# Patient Record
Sex: Female | Born: 1935 | Race: White | Hispanic: No
Health system: Southern US, Community
[De-identification: ages and names within clinical notes are randomized; demographics above are authoritative.]

---

## 2002-02-04 ENCOUNTER — Observation Stay (HOSPITAL_COMMUNITY): Admission: RE | Admit: 2002-02-04 | Discharge: 2002-02-05 | Payer: Self-pay | Admitting: Neurology

## 2002-02-04 ENCOUNTER — Encounter: Payer: Self-pay | Admitting: Neurology

## 2002-03-31 ENCOUNTER — Encounter: Payer: Self-pay | Admitting: Neurosurgery

## 2002-04-05 ENCOUNTER — Inpatient Hospital Stay (HOSPITAL_COMMUNITY): Admission: RE | Admit: 2002-04-05 | Discharge: 2002-04-06 | Payer: Self-pay | Admitting: Neurosurgery

## 2002-05-10 ENCOUNTER — Encounter: Payer: Self-pay | Admitting: Neurosurgery

## 2002-05-10 ENCOUNTER — Encounter: Admission: RE | Admit: 2002-05-10 | Discharge: 2002-05-10 | Payer: Self-pay | Admitting: Neurosurgery

## 2003-07-01 ENCOUNTER — Ambulatory Visit (HOSPITAL_COMMUNITY): Admission: RE | Admit: 2003-07-01 | Discharge: 2003-07-01 | Payer: Self-pay | Admitting: Neurosurgery

## 2003-09-23 ENCOUNTER — Encounter: Admission: RE | Admit: 2003-09-23 | Discharge: 2003-09-23 | Payer: Self-pay | Admitting: Neurosurgery

## 2003-12-15 ENCOUNTER — Inpatient Hospital Stay (HOSPITAL_COMMUNITY): Admission: RE | Admit: 2003-12-15 | Discharge: 2003-12-16 | Payer: Self-pay | Admitting: Neurosurgery

## 2004-05-10 ENCOUNTER — Ambulatory Visit: Payer: Self-pay | Admitting: Oncology

## 2004-06-26 ENCOUNTER — Ambulatory Visit: Payer: Self-pay | Admitting: Ophthalmology

## 2004-07-03 ENCOUNTER — Ambulatory Visit: Payer: Self-pay | Admitting: Ophthalmology

## 2004-11-07 ENCOUNTER — Ambulatory Visit: Payer: Self-pay | Admitting: Oncology

## 2004-12-13 ENCOUNTER — Ambulatory Visit: Payer: Self-pay | Admitting: Internal Medicine

## 2005-05-06 ENCOUNTER — Ambulatory Visit: Payer: Self-pay | Admitting: Oncology

## 2005-05-23 ENCOUNTER — Ambulatory Visit: Payer: Self-pay | Admitting: Oncology

## 2005-11-12 ENCOUNTER — Ambulatory Visit: Payer: Self-pay | Admitting: Oncology

## 2005-11-20 ENCOUNTER — Ambulatory Visit: Payer: Self-pay | Admitting: Oncology

## 2006-08-04 ENCOUNTER — Ambulatory Visit: Payer: Self-pay | Admitting: Oncology

## 2006-08-05 ENCOUNTER — Ambulatory Visit: Payer: Self-pay | Admitting: Oncology

## 2006-08-21 ENCOUNTER — Ambulatory Visit: Payer: Self-pay | Admitting: Oncology

## 2006-09-21 ENCOUNTER — Ambulatory Visit: Payer: Self-pay | Admitting: Oncology

## 2006-11-30 ENCOUNTER — Inpatient Hospital Stay: Payer: Self-pay | Admitting: Specialist

## 2006-11-30 ENCOUNTER — Other Ambulatory Visit: Payer: Self-pay

## 2007-01-15 ENCOUNTER — Ambulatory Visit: Payer: Self-pay | Admitting: Gastroenterology

## 2007-01-21 ENCOUNTER — Ambulatory Visit: Payer: Self-pay | Admitting: Oncology

## 2007-02-09 ENCOUNTER — Ambulatory Visit: Payer: Self-pay | Admitting: Oncology

## 2007-02-21 ENCOUNTER — Ambulatory Visit: Payer: Self-pay | Admitting: Oncology

## 2007-05-05 ENCOUNTER — Emergency Department: Payer: Self-pay | Admitting: Internal Medicine

## 2007-05-11 ENCOUNTER — Ambulatory Visit: Payer: Self-pay | Admitting: General Practice

## 2007-07-22 ENCOUNTER — Ambulatory Visit: Payer: Self-pay | Admitting: Oncology

## 2007-07-27 ENCOUNTER — Ambulatory Visit: Payer: Self-pay | Admitting: Oncology

## 2007-08-21 ENCOUNTER — Ambulatory Visit: Payer: Self-pay | Admitting: Oncology

## 2007-09-21 ENCOUNTER — Ambulatory Visit: Payer: Self-pay | Admitting: Oncology

## 2007-10-21 ENCOUNTER — Ambulatory Visit: Payer: Self-pay | Admitting: Oncology

## 2007-12-22 ENCOUNTER — Ambulatory Visit: Payer: Self-pay | Admitting: Oncology

## 2008-01-18 ENCOUNTER — Ambulatory Visit: Payer: Self-pay | Admitting: Oncology

## 2008-01-21 ENCOUNTER — Ambulatory Visit: Payer: Self-pay | Admitting: Oncology

## 2008-12-06 ENCOUNTER — Ambulatory Visit: Payer: Self-pay | Admitting: Oncology

## 2008-12-21 ENCOUNTER — Ambulatory Visit: Payer: Self-pay | Admitting: Oncology

## 2009-01-20 ENCOUNTER — Ambulatory Visit: Payer: Self-pay | Admitting: Oncology

## 2009-01-24 ENCOUNTER — Ambulatory Visit: Payer: Self-pay | Admitting: Oncology

## 2009-02-20 ENCOUNTER — Ambulatory Visit: Payer: Self-pay | Admitting: Oncology

## 2010-05-04 ENCOUNTER — Emergency Department: Payer: Self-pay | Admitting: Unknown Physician Specialty

## 2010-05-15 ENCOUNTER — Emergency Department: Payer: Self-pay | Admitting: Emergency Medicine

## 2011-03-09 ENCOUNTER — Inpatient Hospital Stay: Payer: Self-pay | Admitting: Internal Medicine

## 2011-03-10 DIAGNOSIS — I359 Nonrheumatic aortic valve disorder, unspecified: Secondary | ICD-10-CM

## 2011-03-21 ENCOUNTER — Inpatient Hospital Stay: Payer: Self-pay | Admitting: Internal Medicine

## 2011-03-28 ENCOUNTER — Encounter: Payer: Medicare Other | Admitting: Cardiovascular Disease

## 2011-04-18 ENCOUNTER — Encounter: Payer: Self-pay | Admitting: Cardiovascular Disease

## 2011-06-03 ENCOUNTER — Emergency Department: Payer: Self-pay | Admitting: Emergency Medicine

## 2011-06-03 LAB — CBC
MCH: 30.4 pg (ref 26.0–34.0)
MCHC: 32.7 g/dL (ref 32.0–36.0)
Platelet: 168 10*3/uL (ref 150–440)
RBC: 3.41 10*6/uL — ABNORMAL LOW (ref 3.80–5.20)

## 2011-06-03 LAB — URINALYSIS, COMPLETE
Bilirubin,UR: NEGATIVE
Glucose,UR: 150 mg/dL (ref 0–75)
RBC,UR: 7 /HPF (ref 0–5)
Squamous Epithelial: <1

## 2011-06-03 LAB — COMPREHENSIVE METABOLIC PANEL
Albumin: 3.5 g/dL (ref 3.4–5.0)
Alkaline Phosphatase: 39 U/L — ABNORMAL LOW (ref 50–136)
Anion Gap: 12 (ref 7–16)
Calcium, Total: 9.1 mg/dL (ref 8.5–10.1)
Co2: 24 mmol/L (ref 21–32)
EGFR (Non-African Amer.): 33 — ABNORMAL LOW
Glucose: 81 mg/dL (ref 65–99)
Osmolality: 293 (ref 275–301)
Potassium: 3.7 mmol/L (ref 3.5–5.1)
SGOT(AST): 15 U/L (ref 15–37)
Sodium: 146 mmol/L — ABNORMAL HIGH (ref 136–145)

## 2011-06-03 LAB — PROTIME-INR
INR: 1
Prothrombin Time: 13.3 secs (ref 11.5–14.7)

## 2013-06-26 IMAGING — CR DG ELBOW COMPLETE 3+V*L*
1 series · 5 of 5 positions shown · non-contrast
Comparison: none

REASON FOR EXAM: fall injury
COMMENTS:

PROCEDURE:     DXR - DXR ELBOW LT COMP W/OBLIQUES  - March 21, 2011  [DATE]
RESULT:     Comparison: None.

[Series 2: x elbow obl left · 0.14mm/px · 5 of 5 slices shown]
[im 1/5]
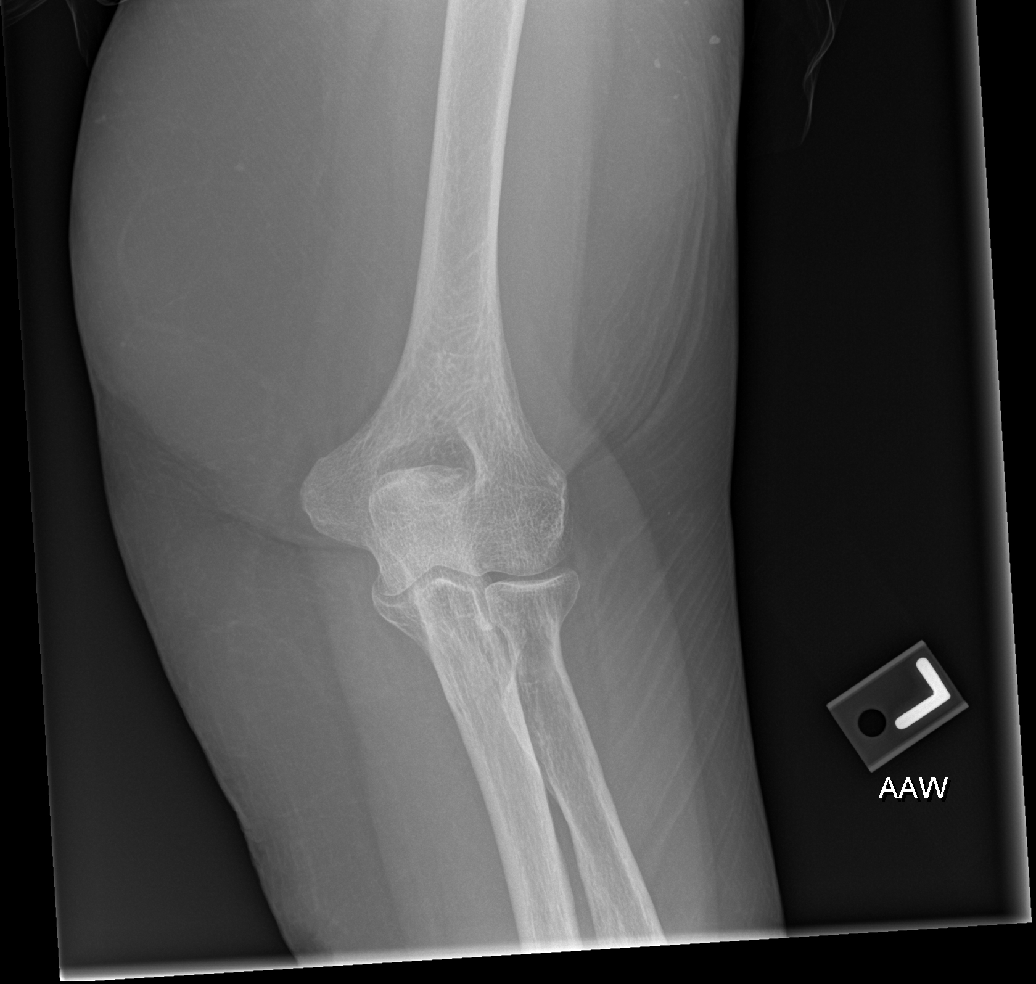
[im 2/5]
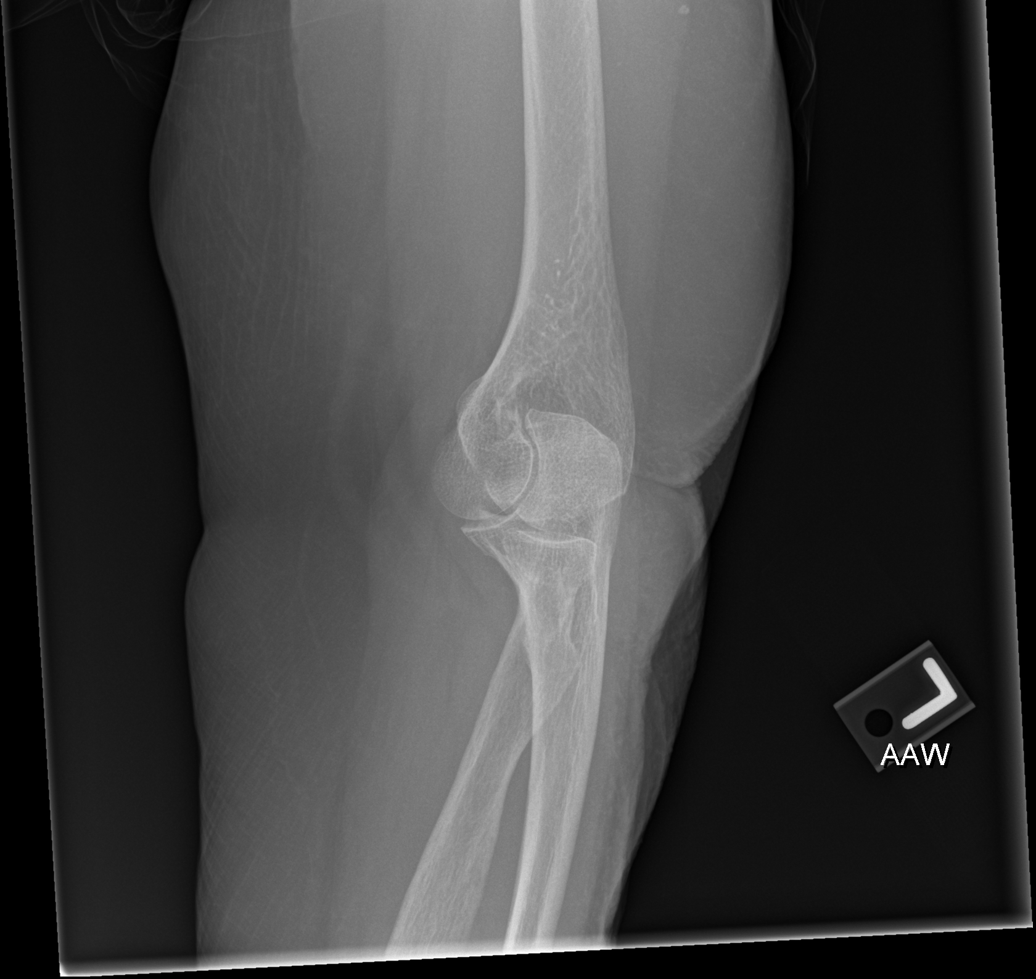
[im 3/5]
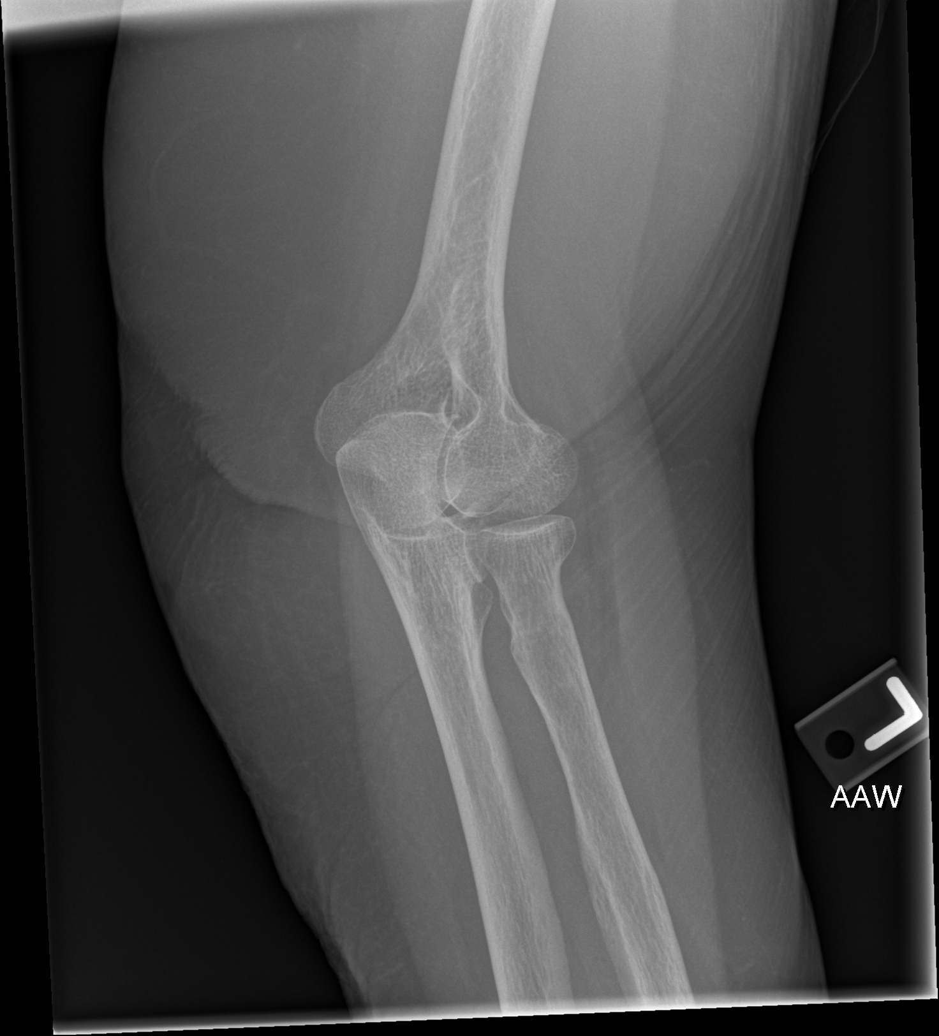
[im 4/5]
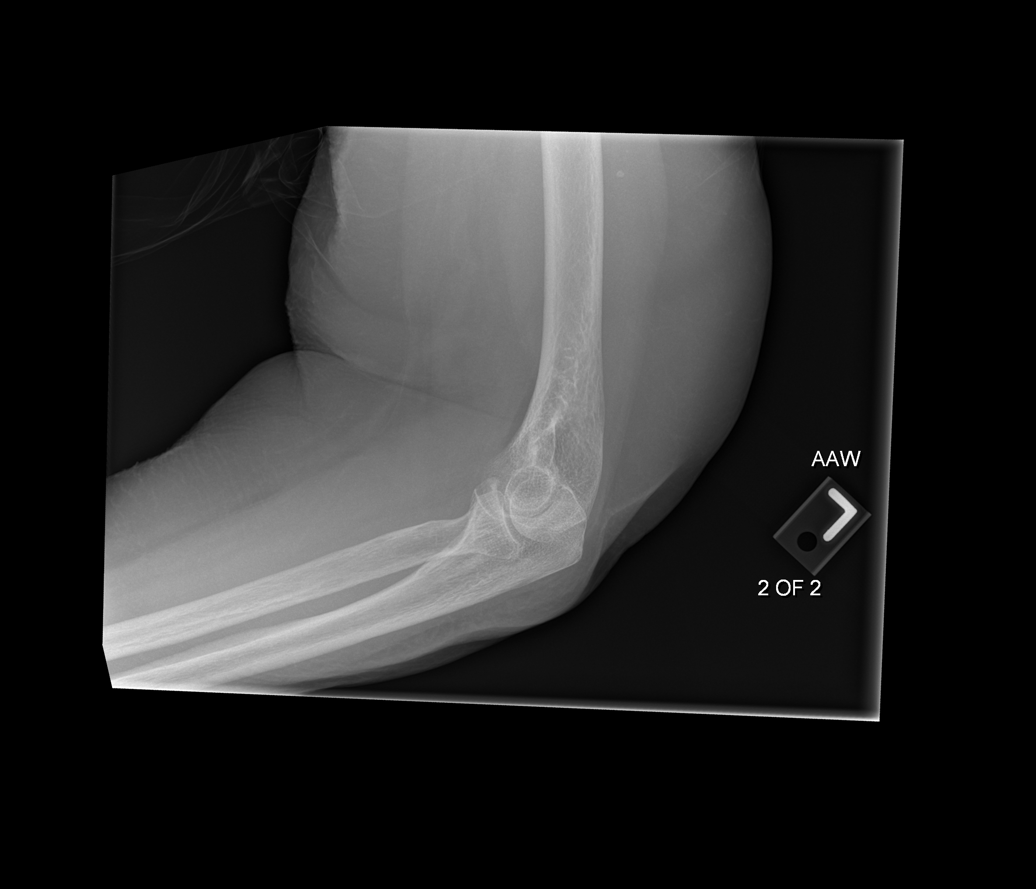
[im 5/5]
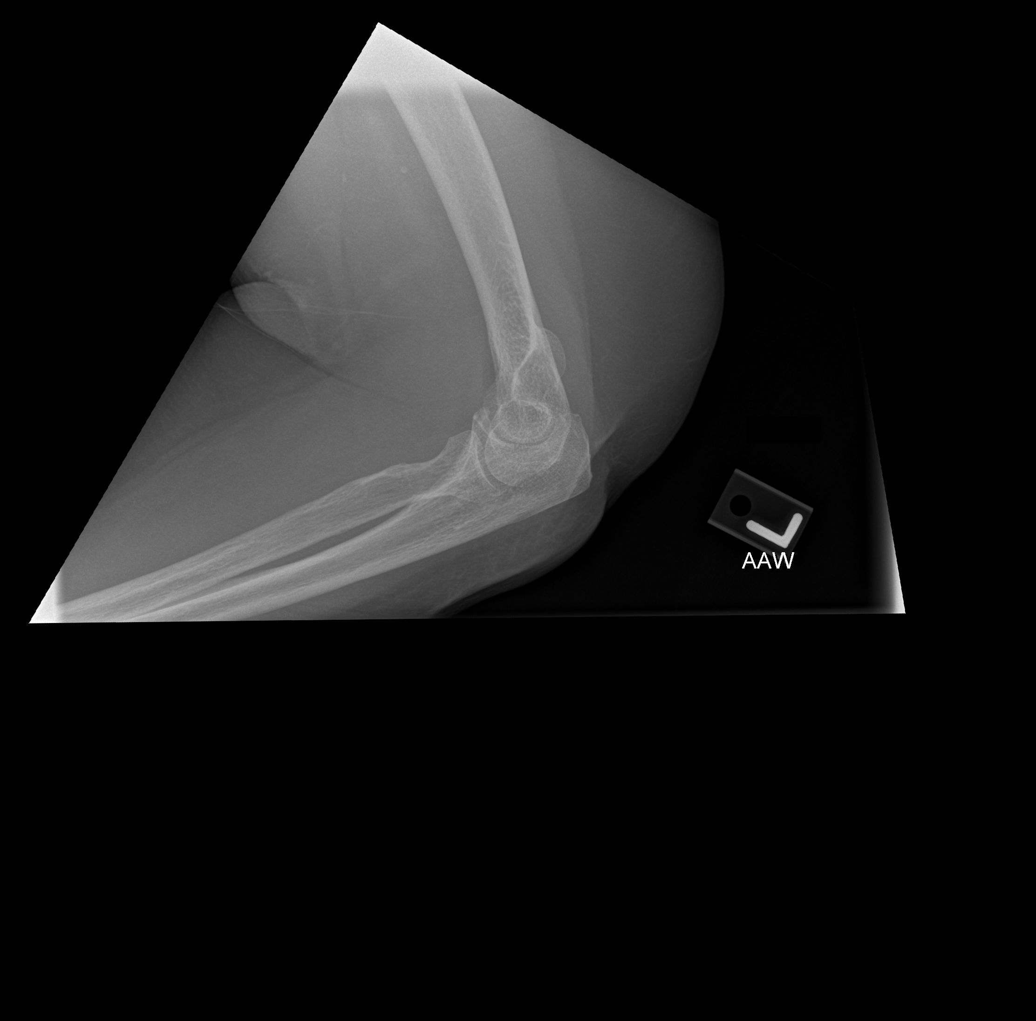

[5 of 5 positions shown; findings below may reference images not displayed]

FINDINGS: No acute fracture. No significant effusion. There is normal alignment.
IMPRESSION: No acute fracture seen.

## 2013-09-08 IMAGING — CT CT HEAD WITHOUT CONTRAST
2 series · 15 of 30 positions shown, 19 images · non-contrast
Comparison: none

REASON FOR EXAM: ams
COMMENTS:   May transport without cardiac monitor

[Series 2: without · axial · non-contrast · 0.44mm/px · z∈[+543,+673]mm · 13 of 32 slices shown, 17 images]
[im 3/32  brain]
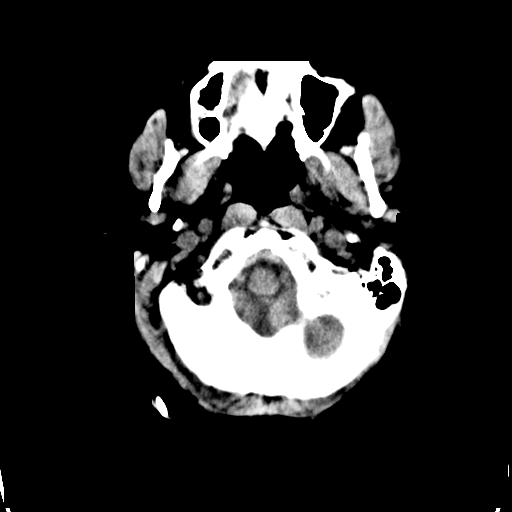
[im 3/32  bone]
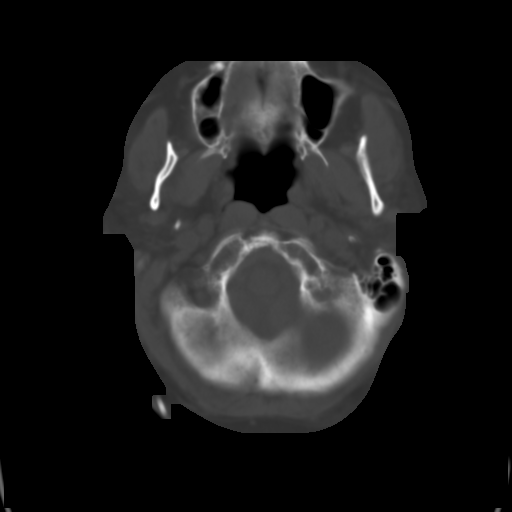
[im 5/32  brain]
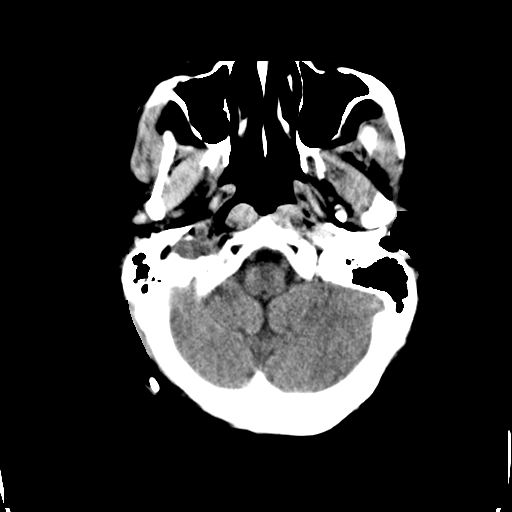
[im 7/32  brain]
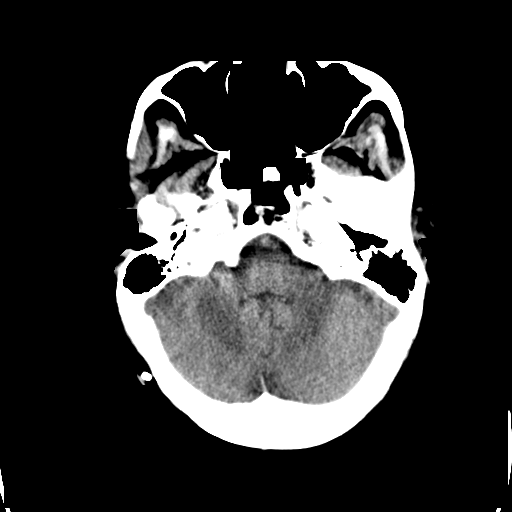
[im 9/32  brain]
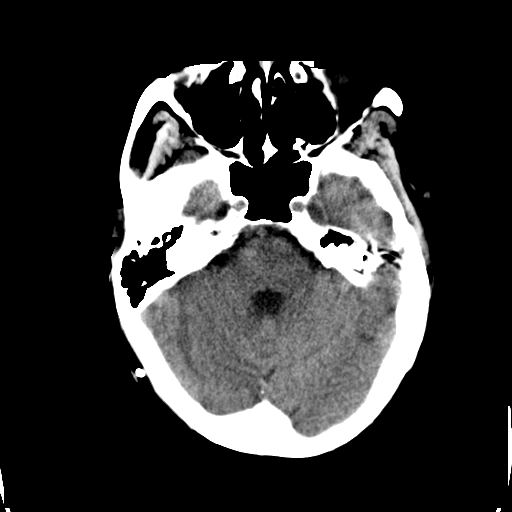
[im 12/32  brain]
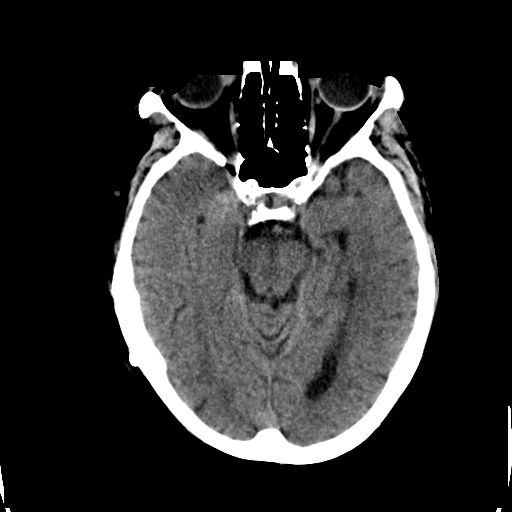
[im 12/32  bone]
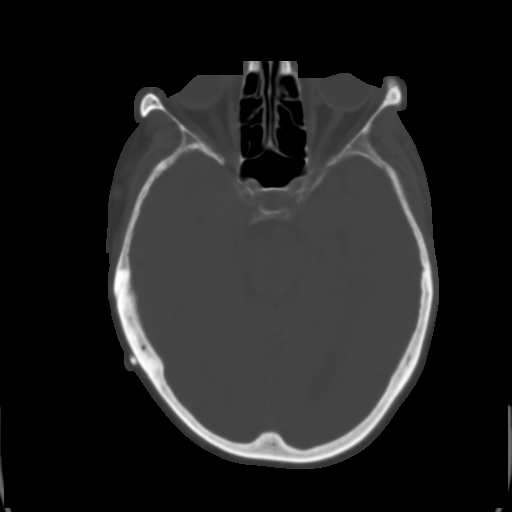
[im 14/32  brain]
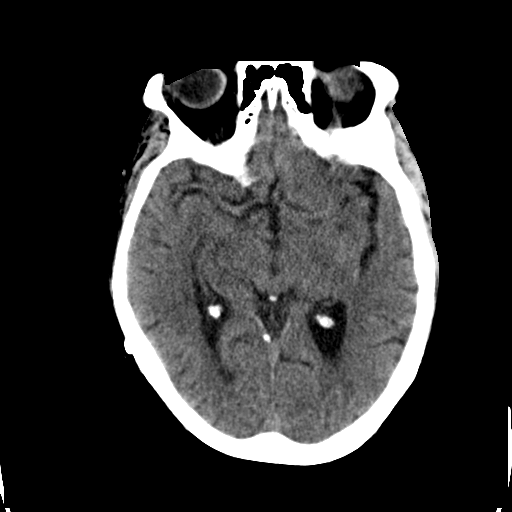
[im 16/32  brain]
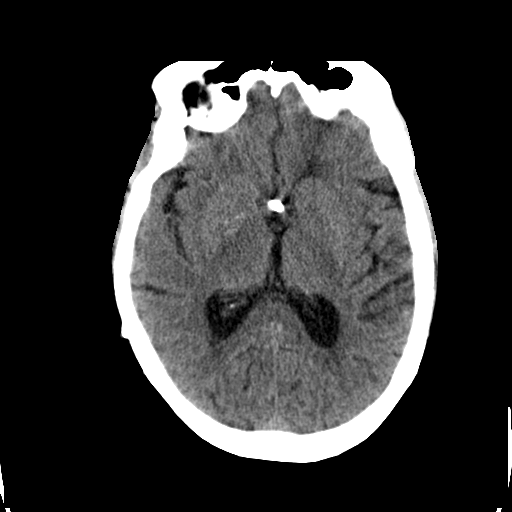
[im 18/32  brain]
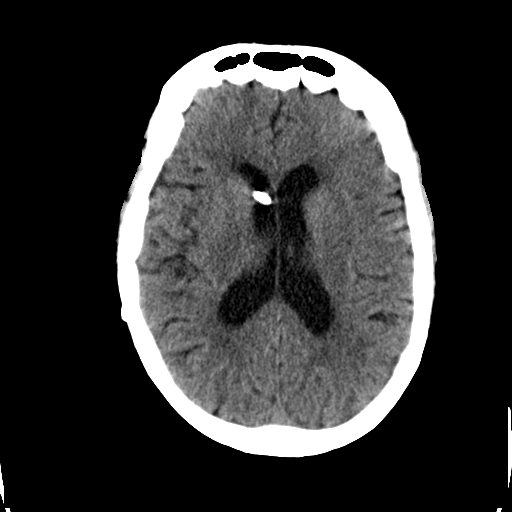
[im 20/32  brain]
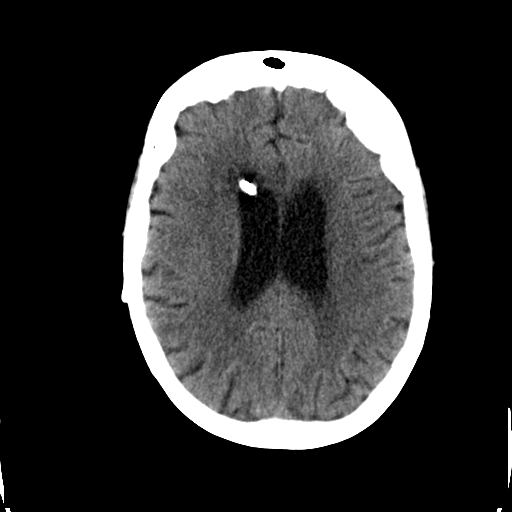
[im 20/32  bone]
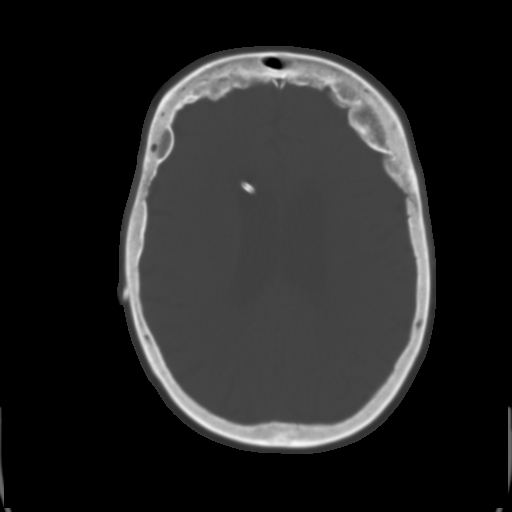
[im 23/32  brain]
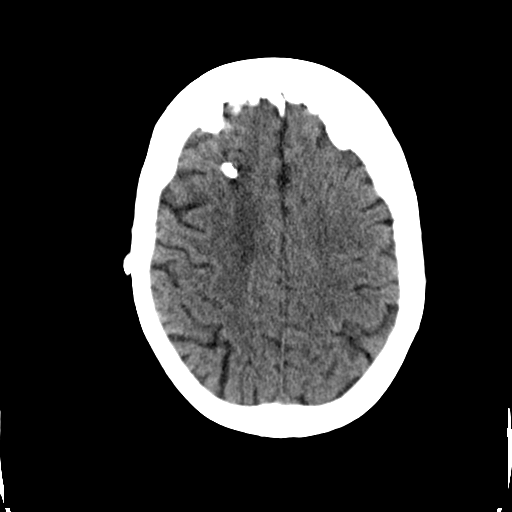
[im 25/32  brain]
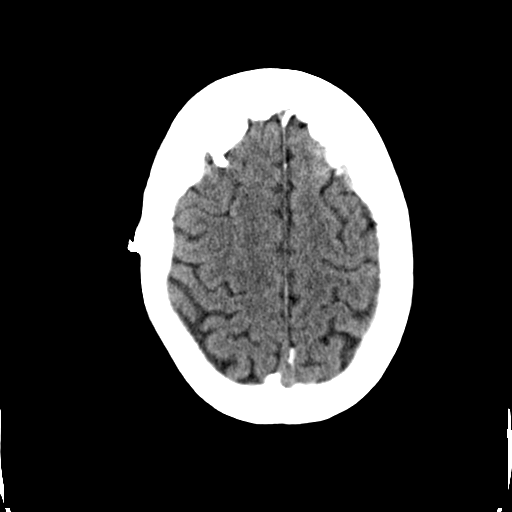
[im 27/32  brain]
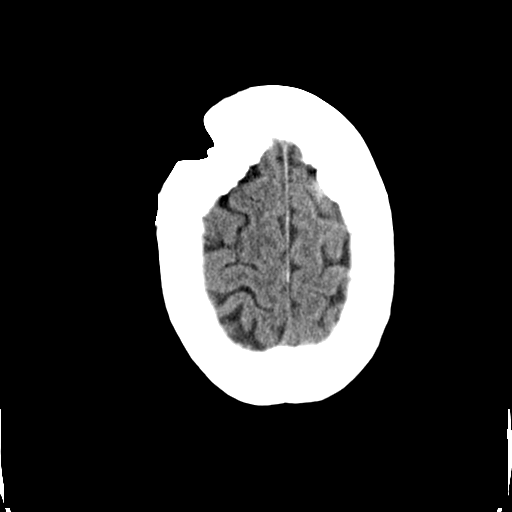
[im 29/32  brain]
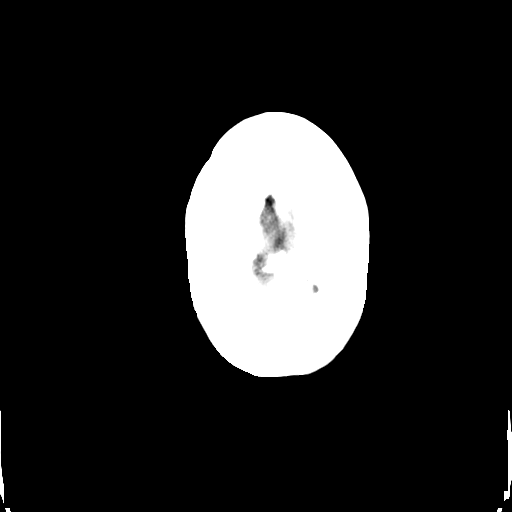
[im 29/32  bone]
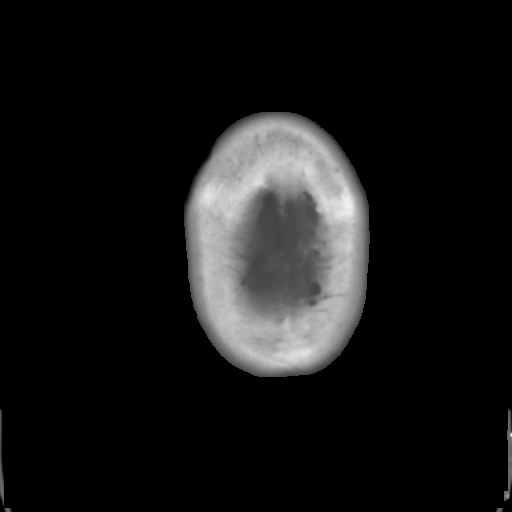

[Series 3: bone · axial · 0.44mm/px · z∈[+543,+563]mm · 2 of 32 slices shown]
[im 3/32  bone]
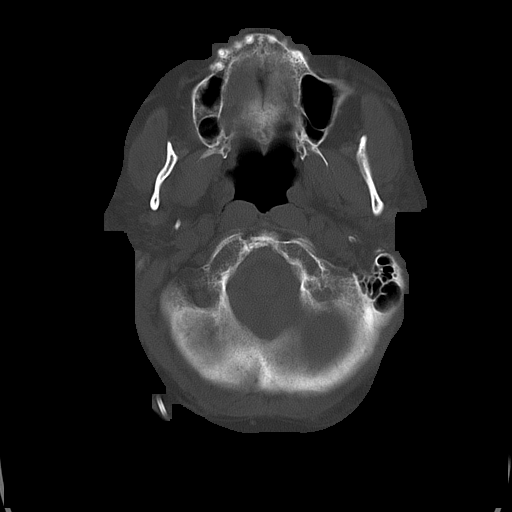
[im 7/32  bone]
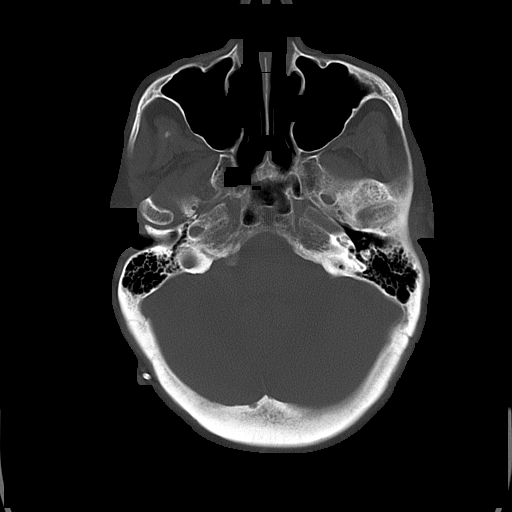

[15 of 30 positions shown; findings below may reference images not displayed]

PROCEDURE:     CT  - CT HEAD WITHOUT CONTRAST  - June 03, 2011  [DATE]

RESULT:     Axial noncontrast CT scanning was performed through the brain
with reconstructions at 5 mm intervals and slice thicknesses. Comparison
made to the study 09 March, 2011.

There is a ventricular shunt in place via the right frontal approach. The
shunt tube appears unchanged in position with the tip at the junction of the
frontal horns of the lateral ventricles. There is no shift of the midline.
There is no hydrocephalus. There is no intracranial hemorrhage nor evidence
of an evolving ischemic infarction. I see no abnormal intracranial
calcifications.

At bone window settings the observed portions of the paranasal sinuses and
mastoid air cells are clear. There is no evidence of an acute skull fracture.
IMPRESSION: 1. I see no evidence of an acute ischemic or hemorrhagic infarction.
2. There is no evidence of malfunction of the ventricular shunt. The
ventricles are normal in size and position for age.
3. I see no acute abnormality elsewhere within the brain.

## 2014-05-23 DEATH — deceased

## 2015-03-01 NOTE — Progress Notes (Signed)
This encounter was created in error - please disregard.
